# Patient Record
Sex: Female | Born: 1995 | Marital: Single | Smoking: Never smoker
Health system: Southern US, Community
[De-identification: ages and names within clinical notes are randomized; demographics above are authoritative.]

---

## 2017-01-25 DIAGNOSIS — Z8751 Personal history of pre-term labor: Secondary | ICD-10-CM | POA: Insufficient documentation

## 2021-04-24 DIAGNOSIS — Z113 Encounter for screening for infections with a predominantly sexual mode of transmission: Secondary | ICD-10-CM | POA: Diagnosis not present

## 2021-04-24 DIAGNOSIS — Z01419 Encounter for gynecological examination (general) (routine) without abnormal findings: Secondary | ICD-10-CM | POA: Diagnosis not present

## 2021-04-24 DIAGNOSIS — Z124 Encounter for screening for malignant neoplasm of cervix: Secondary | ICD-10-CM | POA: Diagnosis not present

## 2021-04-24 DIAGNOSIS — Z3041 Encounter for surveillance of contraceptive pills: Secondary | ICD-10-CM | POA: Diagnosis not present

## 2021-04-27 LAB — HM PAP SMEAR: HM Pap smear: NEGATIVE

## 2021-06-19 ENCOUNTER — Ambulatory Visit (INDEPENDENT_AMBULATORY_CARE_PROVIDER_SITE_OTHER): Payer: BC Managed Care – PPO | Admitting: Medical-Surgical

## 2021-06-19 ENCOUNTER — Encounter: Payer: Self-pay | Admitting: Medical-Surgical

## 2021-06-19 ENCOUNTER — Other Ambulatory Visit: Payer: Self-pay

## 2021-06-19 VITALS — BP 118/78 | HR 68 | Resp 20 | Ht 60.24 in | Wt 173.1 lb

## 2021-06-19 DIAGNOSIS — Z13220 Encounter for screening for lipoid disorders: Secondary | ICD-10-CM

## 2021-06-19 DIAGNOSIS — Z131 Encounter for screening for diabetes mellitus: Secondary | ICD-10-CM

## 2021-06-19 DIAGNOSIS — Z1329 Encounter for screening for other suspected endocrine disorder: Secondary | ICD-10-CM | POA: Diagnosis not present

## 2021-06-19 DIAGNOSIS — R1011 Right upper quadrant pain: Secondary | ICD-10-CM | POA: Diagnosis not present

## 2021-06-19 DIAGNOSIS — R0781 Pleurodynia: Secondary | ICD-10-CM | POA: Diagnosis not present

## 2021-06-19 DIAGNOSIS — R1909 Other intra-abdominal and pelvic swelling, mass and lump: Secondary | ICD-10-CM

## 2021-06-19 DIAGNOSIS — S6710XA Crushing injury of unspecified finger(s), initial encounter: Secondary | ICD-10-CM

## 2021-06-19 NOTE — Progress Notes (Signed)
New Patient Office Visit  Subjective:  Patient ID: Lynn Newton, female    DOB: April 26, 1996  Age: 25 y.o. MRN: [id number]  CC:  Chief Complaint  Patient presents with   Establish Care    HPI Lynn Newton presents to establish care.   Left groin lump-has had a left groin lump and the pubic area for approximately 6 months.  It does get larger and can be tender to touch at times.  Has squeezed on the lump several times and has been able to express some discharge.  Shaves in the pubic area.  No concern for STIs.  In a monogamous relationship with 1 female partner, no condoms.  Right rib/right upper quadrant pain-has had intermittent pain going on for the last couple of years but this has become more frequent.  Happens when she eats greasy foods.  She does try to avoid greasy foods but occasionally does indulge in pizza and wings.  Whenever she does eat these type foods, the pain occurs approximately 2 hours after.  She has nausea and vomiting that accompany the pain and reports that it does not improve until she throws up.  No fevers, chills, constipation,, diarrhea, chest pain, or shortness of breath.  Approximately 3 weeks ago, had a injury to her right hand over the lid of a trunk slammed down over her finger.  She has a lump on the proximal fourth finger that is new since the injury.  Denies pain at the site.  History reviewed. No pertinent past medical history.  History reviewed. No pertinent surgical history.  History reviewed. No pertinent family history.  Social History   Socioeconomic History   Marital status: Single    Spouse name: Not on file   Number of children: Not on file   Years of education: Not on file   Highest education level: Not on file  Occupational History   Not on file  Tobacco Use   Smoking status: Not on file   Smokeless tobacco: Not on file  Substance and Sexual Activity   Alcohol use: Not on file   Drug use: Not on file   Sexual  activity: Not on file  Other Topics Concern   Not on file  Social History Narrative   Not on file   Social Determinants of Health   Financial Resource Strain: Not on file  Food Insecurity: Not on file  Transportation Needs: Not on file  Physical Activity: Not on file  Stress: Not on file  Social Connections: Not on file  Intimate Partner Violence: Not on file    ROS Review of Systems  Constitutional:  Negative for chills, fatigue, fever and unexpected weight change.  Respiratory:  Negative for cough, chest tightness, shortness of breath and wheezing.   Cardiovascular:  Negative for chest pain, palpitations and leg swelling.  Gastrointestinal:  Positive for abdominal pain, nausea and vomiting. Negative for blood in stool, constipation and diarrhea.  Endocrine: Positive for polydipsia. Negative for cold intolerance, heat intolerance, polyphagia and polyuria.  Genitourinary:  Negative for dysuria, frequency, hematuria and urgency.  Musculoskeletal:  Positive for joint swelling (Right hand proximal fourth finger).  Neurological:  Negative for dizziness, light-headedness and headaches.  Psychiatric/Behavioral:  Negative for dysphoric mood, self-injury, sleep disturbance and suicidal ideas. The patient is nervous/anxious.    Objective:   Today's Vitals: BP 118/78 (BP Location: Right Arm, Patient Position: Sitting, Cuff Size: Normal)   Pulse 68   Resp 20   Ht 5' 0.24" (1.53 m)  Wt 173 lb 1.6 oz (78.5 kg)   SpO2 97%   BMI 33.54 kg/m   Physical Exam Vitals reviewed.  Constitutional:      General: She is not in acute distress.    Appearance: Normal appearance.  HENT:     Head: Normocephalic and atraumatic.  Cardiovascular:     Rate and Rhythm: Normal rate and regular rhythm.     Pulses: Normal pulses.     Heart sounds: Normal heart sounds. No murmur heard.   No friction rub. No gallop.  Pulmonary:     Effort: Pulmonary effort is normal. No respiratory distress.     Breath  sounds: Normal breath sounds. No wheezing.  Abdominal:     General: Bowel sounds are normal. There is no distension.     Palpations: Abdomen is soft. There is no fluid wave, hepatomegaly, splenomegaly, mass or pulsatile mass.     Tenderness: There is no abdominal tenderness.     Hernia: No hernia is present.  Genitourinary:    Comments: No left groin/pubic lump visible or palpable during exam today.  Patient unable to pinpoint location during appointment. Musculoskeletal:     Right hand: Swelling (Proximal dorsal right fourth finger) present.  Skin:    General: Skin is warm and dry.  Neurological:     Mental Status: She is alert and oriented to person, place, and time.  Psychiatric:        Mood and Affect: Mood normal.        Behavior: Behavior normal.        Thought Content: Thought content normal.        Judgment: Judgment normal.    Assessment & Plan:   1. Rib pain on right side 2. RUQ pain Presentation and associated pain with fatty food intake suspicious for gallbladder etiology.  Checking CBC with differential, CMP, amylase, lipase, and getting a right upper quadrant ultrasound. - CBC with Differential/Platelet - COMPLETE METABOLIC PANEL WITH GFR - Amylase - Lipase - US ABDOMEN LIMITED RUQ (LIVER/GB); Future  3. Thyroid disorder screen Checking thyroid function today. - TSH  4. Diabetes mellitus screening Checking hemoglobin A1c. - Hemoglobin A1c  5. Lipid screening Checking lipid panel. - Lipid panel  6. Crushing injury of finger of right hand No pain on exam the indicating this is likely the result of the healing process.  We will go ahead and get right hand x-rays today. - DG Hand Complete Right; Future  7. Groin lump Since we are unable to find the lump today, advised patient to continue monitoring for this.  If this returns and is painful to touch, return to our office for further evaluation before doing any manipulation of the lesion.  Suspect that shaving  predisposes her to ingrown hairs which may be causing the recurrent swelling and discomfort.  Outpatient Encounter Medications as of 06/19/2021  Medication Sig   VIENVA 0.1-20 MG-MCG tablet Take 1 tablet by mouth daily.   No facility-administered encounter medications on file as of 06/19/2021.    Follow-up: Return if symptoms worsen or fail to improve.  Further follow-up pending imaging and lab results.  Thayer Ohm, DNP, APRN, FNP-BC  [facility]

## 2021-06-20 LAB — COMPLETE METABOLIC PANEL WITH GFR
AG Ratio: 1.5 (calc) (ref 1.0–2.5)
ALT: 12 U/L (ref 6–29)
AST: 14 U/L (ref 10–30)
Albumin: 4.3 g/dL (ref 3.6–5.1)
Alkaline phosphatase (APISO): 43 U/L (ref 31–125)
BUN: 13 mg/dL (ref 7–25)
CO2: 26 mmol/L (ref 20–32)
Calcium: 9.1 mg/dL (ref 8.6–10.2)
Chloride: 104 mmol/L (ref 98–110)
Creat: 0.79 mg/dL (ref 0.50–0.96)
Globulin: 2.8 g/dL (calc) (ref 1.9–3.7)
Glucose, Bld: 73 mg/dL (ref 65–99)
Potassium: 3.9 mmol/L (ref 3.5–5.3)
Sodium: 139 mmol/L (ref 135–146)
Total Bilirubin: 0.3 mg/dL (ref 0.2–1.2)
Total Protein: 7.1 g/dL (ref 6.1–8.1)
eGFR: 106 mL/min/{1.73_m2} (ref >=60)

## 2021-06-20 LAB — CBC WITH DIFFERENTIAL/PLATELET
Absolute Monocytes: 446 cells/uL (ref 200–950)
Basophils Absolute: 41 cells/uL (ref 0–200)
Basophils Relative: 0.5 %
Eosinophils Absolute: 89 cells/uL (ref 15–500)
Eosinophils Relative: 1.1 %
HCT: 42.2 % (ref 35.0–45.0)
Hemoglobin: 14.4 g/dL (ref 11.7–15.5)
Lymphs Abs: 1928 cells/uL (ref 850–3900)
MCH: 30.1 pg (ref 27.0–33.0)
MCHC: 34.1 g/dL (ref 32.0–36.0)
MCV: 88.1 fL (ref 80.0–100.0)
MPV: 10.1 fL (ref 7.5–12.5)
Monocytes Relative: 5.5 %
Neutro Abs: 5597 cells/uL (ref 1500–7800)
Neutrophils Relative %: 69.1 %
Platelets: 294 10*3/uL (ref 140–400)
RBC: 4.79 10*6/uL (ref 3.80–5.10)
RDW: 12.3 % (ref 11.0–15.0)
Total Lymphocyte: 23.8 %
WBC: 8.1 10*3/uL (ref 3.8–10.8)

## 2021-06-20 LAB — HEMOGLOBIN A1C
Hgb A1c MFr Bld: 5.1 % of total Hgb (ref <5.7)
Mean Plasma Glucose: 100 mg/dL
eAG (mmol/L): 5.5 mmol/L

## 2021-06-20 LAB — LIPID PANEL
Cholesterol: 147 mg/dL (ref <200)
HDL: 60 mg/dL (ref >=50)
LDL Cholesterol (Calc): 67 mg/dL (calc)
Non-HDL Cholesterol (Calc): 87 mg/dL (calc) (ref <130)
Total CHOL/HDL Ratio: 2.5 (calc) (ref <5.0)
Triglycerides: 112 mg/dL (ref <150)

## 2021-06-20 LAB — LIPASE: Lipase: 17 U/L (ref 7–60)

## 2021-06-20 LAB — AMYLASE: Amylase: 37 U/L (ref 21–101)

## 2021-06-20 LAB — TSH: TSH: 0.84 mIU/L

## 2021-06-26 ENCOUNTER — Other Ambulatory Visit: Payer: BC Managed Care – PPO

## 2021-07-03 ENCOUNTER — Other Ambulatory Visit: Payer: Self-pay

## 2021-07-03 ENCOUNTER — Telehealth: Payer: Self-pay | Admitting: Medical-Surgical

## 2021-07-03 ENCOUNTER — Ambulatory Visit (INDEPENDENT_AMBULATORY_CARE_PROVIDER_SITE_OTHER): Payer: BC Managed Care – PPO

## 2021-07-03 DIAGNOSIS — R7989 Other specified abnormal findings of blood chemistry: Secondary | ICD-10-CM | POA: Diagnosis not present

## 2021-07-03 DIAGNOSIS — R1011 Right upper quadrant pain: Secondary | ICD-10-CM

## 2021-07-03 DIAGNOSIS — K8020 Calculus of gallbladder without cholecystitis without obstruction: Secondary | ICD-10-CM | POA: Diagnosis not present

## 2021-07-03 NOTE — Telephone Encounter (Signed)
Patient dropped off forms to be filled out by PCP, stated that she had an appointment with PCP a few weeks ago (looked like a new patient appt). Billing form attached and placed in provider box. AM

## 2021-07-07 ENCOUNTER — Encounter: Payer: Self-pay | Admitting: Medical-Surgical

## 2021-07-07 DIAGNOSIS — K8020 Calculus of gallbladder without cholecystitis without obstruction: Secondary | ICD-10-CM

## 2021-07-08 NOTE — Telephone Encounter (Signed)
Attempted to call pt, no answer. Will try again at a later time.

## 2021-07-09 ENCOUNTER — Encounter: Payer: Self-pay | Admitting: Medical-Surgical

## 2021-07-09 NOTE — Telephone Encounter (Signed)
Forms completed and put in front office. Pt notified.

## 2021-07-28 ENCOUNTER — Encounter: Payer: Self-pay | Admitting: Medical-Surgical

## 2021-08-14 ENCOUNTER — Ambulatory Visit: Payer: Self-pay | Admitting: Surgery

## 2021-08-14 DIAGNOSIS — K8050 Calculus of bile duct without cholangitis or cholecystitis without obstruction: Secondary | ICD-10-CM

## 2022-01-07 DIAGNOSIS — J020 Streptococcal pharyngitis: Secondary | ICD-10-CM | POA: Diagnosis not present

## 2022-03-30 ENCOUNTER — Ambulatory Visit: Payer: Self-pay | Admitting: Surgery

## 2022-03-30 DIAGNOSIS — K8020 Calculus of gallbladder without cholecystitis without obstruction: Secondary | ICD-10-CM

## 2022-03-30 DIAGNOSIS — K8050 Calculus of bile duct without cholangitis or cholecystitis without obstruction: Secondary | ICD-10-CM | POA: Diagnosis not present

## 2022-04-29 DIAGNOSIS — Z01419 Encounter for gynecological examination (general) (routine) without abnormal findings: Secondary | ICD-10-CM | POA: Diagnosis not present

## 2022-04-29 DIAGNOSIS — Z113 Encounter for screening for infections with a predominantly sexual mode of transmission: Secondary | ICD-10-CM | POA: Diagnosis not present

## 2022-04-29 DIAGNOSIS — Z6833 Body mass index (BMI) 33.0-33.9, adult: Secondary | ICD-10-CM | POA: Diagnosis not present

## 2022-04-29 DIAGNOSIS — Z3041 Encounter for surveillance of contraceptive pills: Secondary | ICD-10-CM | POA: Diagnosis not present

## 2022-07-11 NOTE — Progress Notes (Unsigned)
   Complete physical exam  Patient: Lynn Newton   DOB: 1996/06/17   26 y.o. Female  MRN: [id number]  Subjective:    No chief complaint on file.   Lynn Newton is a 26 y.o. female who presents today for a complete physical exam. She reports consuming a {diet types:17450} diet. {types:19826} She generally feels {DESC; WELL/FAIRLY WELL/POORLY:18703}. She reports sleeping {DESC; WELL/FAIRLY WELL/POORLY:18703}. She {does/does not:200015} have additional problems to discuss today.    Most recent fall risk assessment:     No data to display           Most recent depression screenings:     No data to display          {VISON DENTAL STD PSA (Optional):27386}  {History (Optional):23778}  Patient Care Team: Samuel Bouche, NP as PCP - General (Nurse Practitioner)   Outpatient Medications Prior to Visit  Medication Sig   VIENVA 0.1-20 MG-MCG tablet Take 1 tablet by mouth daily.   No facility-administered medications prior to visit.    ROS        Objective:     There were no vitals taken for this visit. {Vitals History (Optional):23777}  Physical Exam   No results found for any visits on 07/12/22. {Show previous labs (optional):23779}    Assessment & Plan:    Routine Health Maintenance and Physical Exam  Immunization History  Administered Date(s) Administered   DTaP 08/17/1996, 10/15/1996, 12/17/1996, 07/03/2002   DTaP / HiB 08/17/1996, 10/15/1996, 12/17/1996   Hepatitis B, PED/ADOLESCENT 08/17/1996, 12/17/1996   IPV 08/17/1996, 10/15/1996, 07/03/2002   MMR 07/03/2002, 08/09/2002   PFIZER Comirnaty(Gray Top)Covid-19 Tri-Sucrose Vaccine 08/25/2020, 09/15/2020    Health Maintenance  Topic Date Due   HPV VACCINES (1 - 2-dose series) Never done   Hepatitis C Screening  Never done   TETANUS/TDAP  Never done   PAP-Cervical Cytology Screening  Never done   PAP SMEAR-Modifier  Never done   COVID-19 Vaccine (3 - Pfizer series) 11/10/2020    INFLUENZA VACCINE  Never done   HIV Screening  Completed    Discussed health benefits of physical activity, and encouraged her to engage in regular exercise appropriate for her age and condition.  Problem List Items Addressed This Visit   None Visit Diagnoses     Annual physical exam    -  Primary   Cervical cancer screening       Need for HPV vaccination       Need for hepatitis C screening test       Encounter for screening for HIV       Need for Tdap vaccination       Need for influenza vaccination          No follow-ups on file.     Samuel Bouche, NP

## 2022-07-12 ENCOUNTER — Ambulatory Visit (INDEPENDENT_AMBULATORY_CARE_PROVIDER_SITE_OTHER): Payer: BC Managed Care – PPO | Admitting: Medical-Surgical

## 2022-07-12 ENCOUNTER — Encounter: Payer: Self-pay | Admitting: Medical-Surgical

## 2022-07-12 VITALS — BP 98/64 | HR 67 | Resp 20 | Ht 60.04 in | Wt 170.9 lb

## 2022-07-12 DIAGNOSIS — Z1159 Encounter for screening for other viral diseases: Secondary | ICD-10-CM | POA: Diagnosis not present

## 2022-07-12 DIAGNOSIS — Z124 Encounter for screening for malignant neoplasm of cervix: Secondary | ICD-10-CM | POA: Diagnosis not present

## 2022-07-12 DIAGNOSIS — E6609 Other obesity due to excess calories: Secondary | ICD-10-CM

## 2022-07-12 DIAGNOSIS — Z0000 Encounter for general adult medical examination without abnormal findings: Secondary | ICD-10-CM | POA: Diagnosis not present

## 2022-07-12 DIAGNOSIS — Z114 Encounter for screening for human immunodeficiency virus [HIV]: Secondary | ICD-10-CM

## 2022-07-12 DIAGNOSIS — Z23 Encounter for immunization: Secondary | ICD-10-CM

## 2022-07-12 DIAGNOSIS — Z113 Encounter for screening for infections with a predominantly sexual mode of transmission: Secondary | ICD-10-CM | POA: Diagnosis not present

## 2022-07-12 DIAGNOSIS — Z6833 Body mass index (BMI) 33.0-33.9, adult: Secondary | ICD-10-CM

## 2022-07-13 LAB — WET PREP FOR TRICH, YEAST, CLUE
MICRO NUMBER:: 13898895
Specimen Quality: ADEQUATE

## 2022-07-14 LAB — COMPLETE METABOLIC PANEL WITH GFR
AG Ratio: 1.3 (calc) (ref 1.0–2.5)
ALT: 9 U/L (ref 6–29)
AST: 15 U/L (ref 10–30)
Albumin: 4.1 g/dL (ref 3.6–5.1)
Alkaline phosphatase (APISO): 40 U/L (ref 31–125)
BUN: 13 mg/dL (ref 7–25)
CO2: 27 mmol/L (ref 20–32)
Calcium: 9.3 mg/dL (ref 8.6–10.2)
Chloride: 102 mmol/L (ref 98–110)
Creat: 0.79 mg/dL (ref 0.50–0.96)
Globulin: 3.1 g/dL (calc) (ref 1.9–3.7)
Glucose, Bld: 90 mg/dL (ref 65–99)
Potassium: 4.1 mmol/L (ref 3.5–5.3)
Sodium: 137 mmol/L (ref 135–146)
Total Bilirubin: 0.2 mg/dL (ref 0.2–1.2)
Total Protein: 7.2 g/dL (ref 6.1–8.1)
eGFR: 106 mL/min/{1.73_m2} (ref >=60)

## 2022-07-14 LAB — CBC WITH DIFFERENTIAL/PLATELET
Absolute Monocytes: 504 cells/uL (ref 200–950)
Basophils Absolute: 37 cells/uL (ref 0–200)
Basophils Relative: 0.5 %
Eosinophils Absolute: 131 cells/uL (ref 15–500)
Eosinophils Relative: 1.8 %
HCT: 39.4 % (ref 35.0–45.0)
Hemoglobin: 13.3 g/dL (ref 11.7–15.5)
Lymphs Abs: 3051 cells/uL (ref 850–3900)
MCH: 29.8 pg (ref 27.0–33.0)
MCHC: 33.8 g/dL (ref 32.0–36.0)
MCV: 88.3 fL (ref 80.0–100.0)
MPV: 9.8 fL (ref 7.5–12.5)
Monocytes Relative: 6.9 %
Neutro Abs: 3577 cells/uL (ref 1500–7800)
Neutrophils Relative %: 49 %
Platelets: 269 10*3/uL (ref 140–400)
RBC: 4.46 10*6/uL (ref 3.80–5.10)
RDW: 12 % (ref 11.0–15.0)
Total Lymphocyte: 41.8 %
WBC: 7.3 10*3/uL (ref 3.8–10.8)

## 2022-07-14 LAB — LIPID PANEL
Cholesterol: 135 mg/dL (ref <200)
HDL: 58 mg/dL (ref >=50)
LDL Cholesterol (Calc): 58 mg/dL (calc)
Non-HDL Cholesterol (Calc): 77 mg/dL (calc) (ref <130)
Total CHOL/HDL Ratio: 2.3 (calc) (ref <5.0)
Triglycerides: 106 mg/dL (ref <150)

## 2022-07-14 LAB — RPR: RPR Ser Ql: NONREACTIVE

## 2022-07-14 LAB — HEPATITIS B SURFACE ANTIGEN: Hepatitis B Surface Ag: NONREACTIVE

## 2022-07-14 LAB — HEMOGLOBIN A1C
Hgb A1c MFr Bld: 5 % of total Hgb (ref <5.7)
Mean Plasma Glucose: 97 mg/dL
eAG (mmol/L): 5.4 mmol/L

## 2022-07-14 LAB — C. TRACHOMATIS/N. GONORRHOEAE RNA
C. trachomatis RNA, TMA: NOT DETECTED
N. gonorrhoeae RNA, TMA: NOT DETECTED

## 2022-07-14 LAB — HIV ANTIBODY (ROUTINE TESTING W REFLEX): HIV 1&2 Ab, 4th Generation: NONREACTIVE

## 2022-07-14 LAB — HEPATITIS C ANTIBODY: Hepatitis C Ab: NONREACTIVE

## 2022-07-28 ENCOUNTER — Encounter: Payer: Self-pay | Admitting: Medical-Surgical

## 2023-06-18 IMAGING — US US ABDOMEN LIMITED
1 series · 14 of 25 positions shown · non-contrast
Comparison: None.

CLINICAL DATA: Abnormal LFTs, intermittent right upper quadrant
pain x2 years.

EXAM:
ULTRASOUND ABDOMEN LIMITED RIGHT UPPER QUADRANT

[Series 1: us abdomen limited ruq (liver/gb) · 14 of 69 slices shown]
[im 1/69]
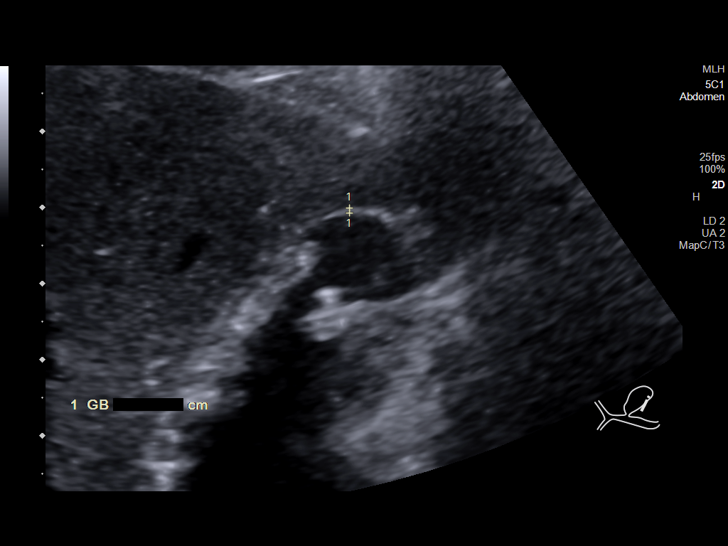
[im 6/69]
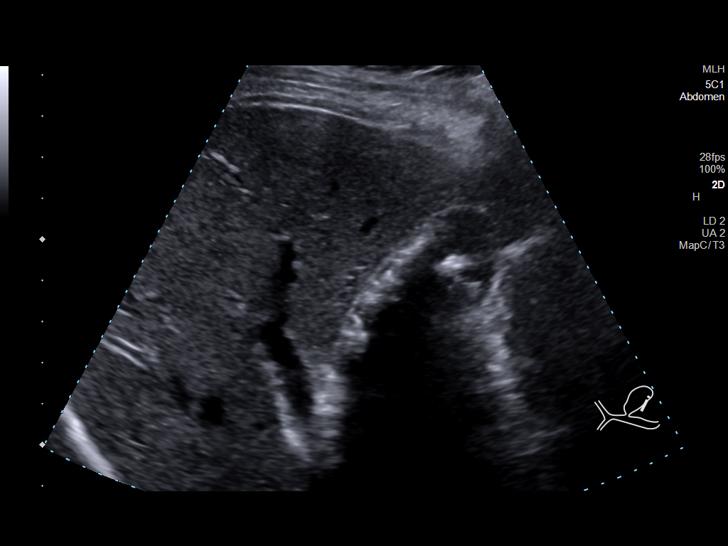
[im 12/69]
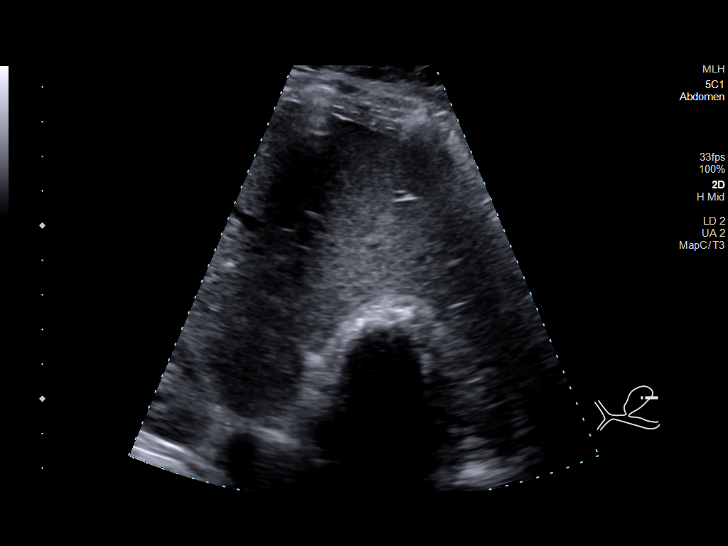
[im 18/69]
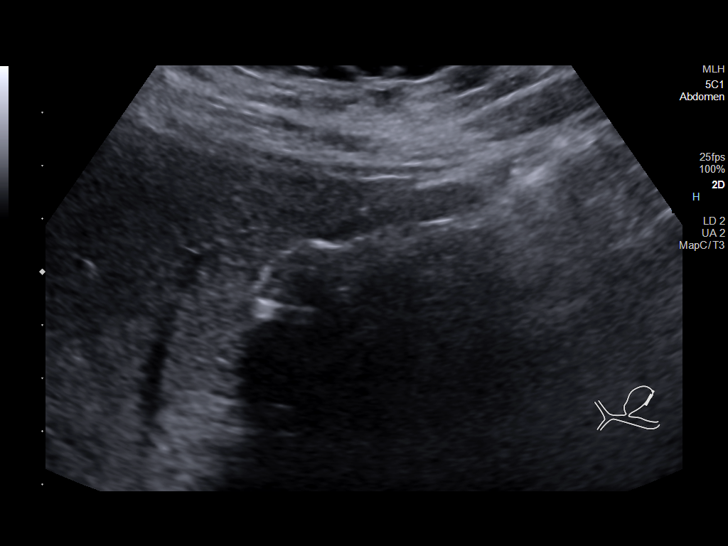
[im 23/69]
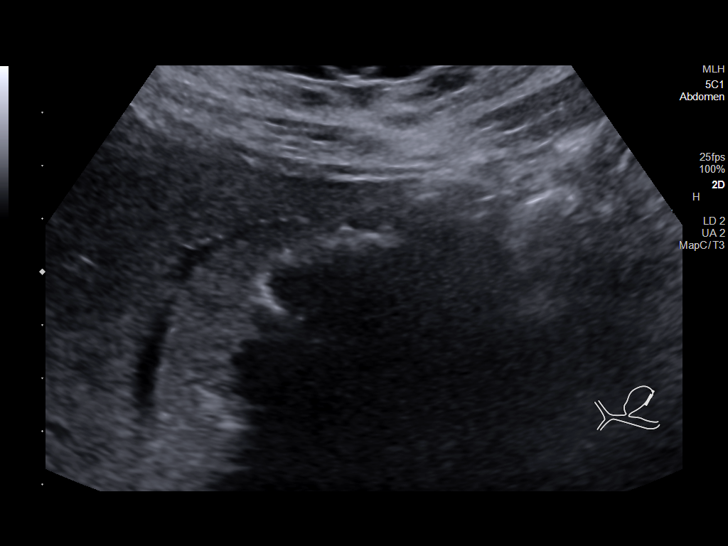
[im 26/69]
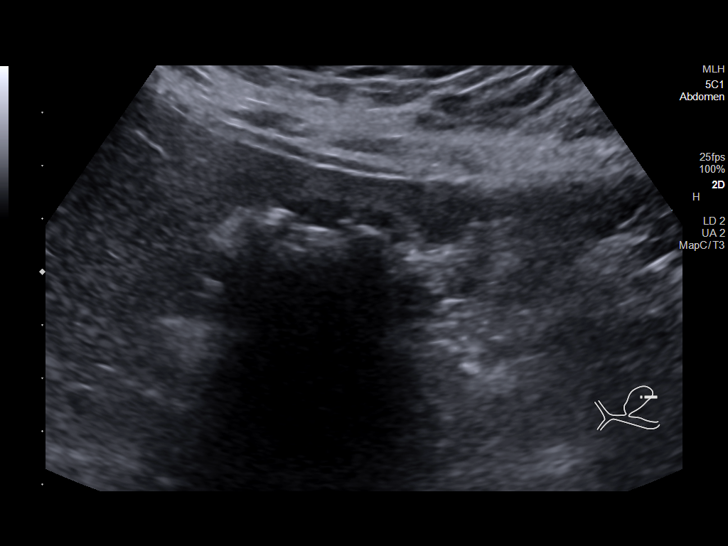
[im 32/69]
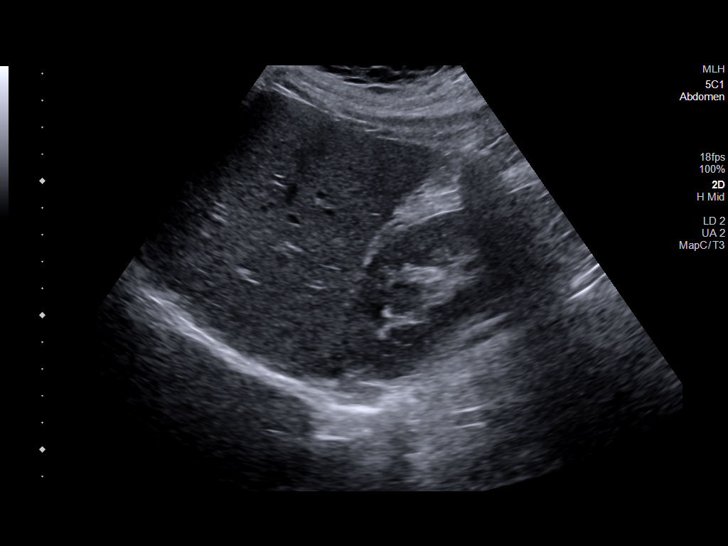
[im 37/69]
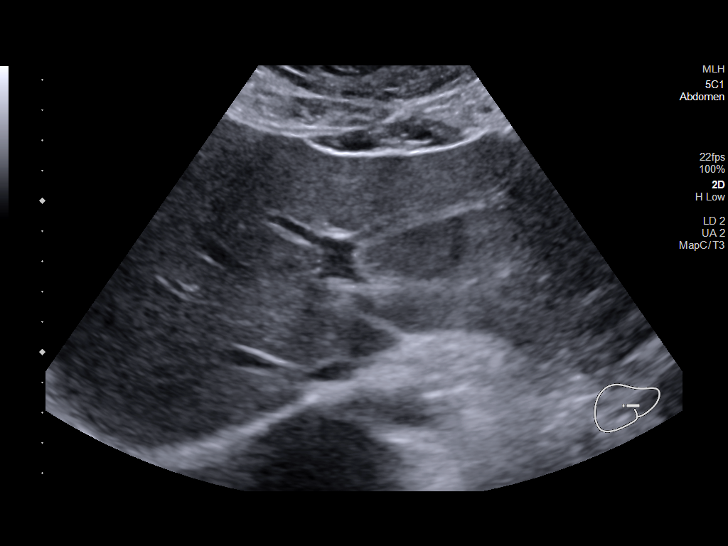
[im 43/69]
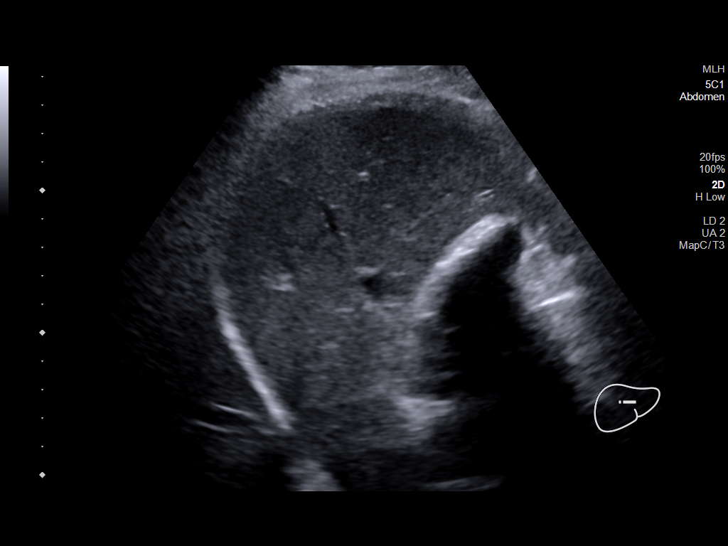
[im 46/69]
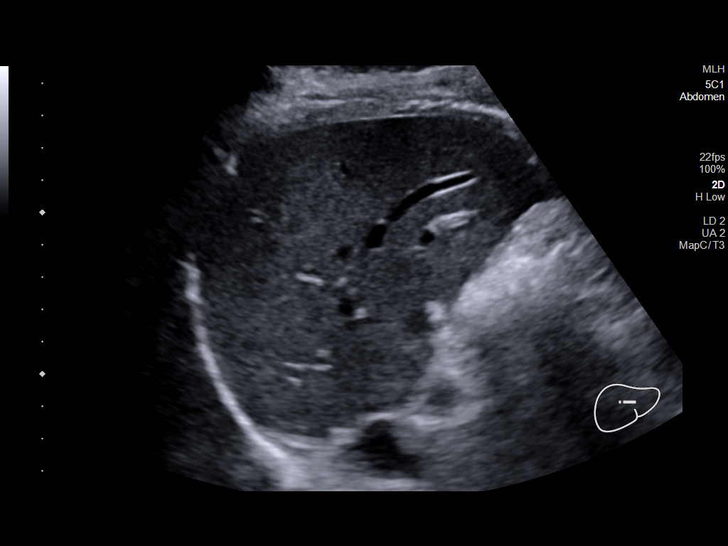
[im 52/69]
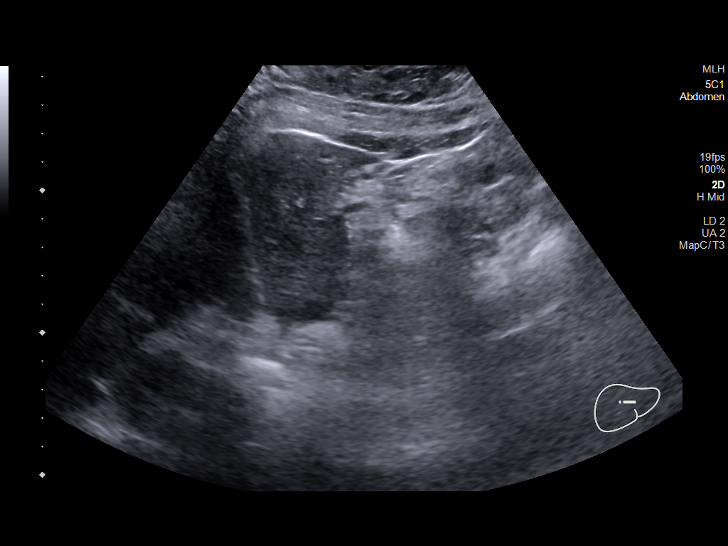
[im 57/69]
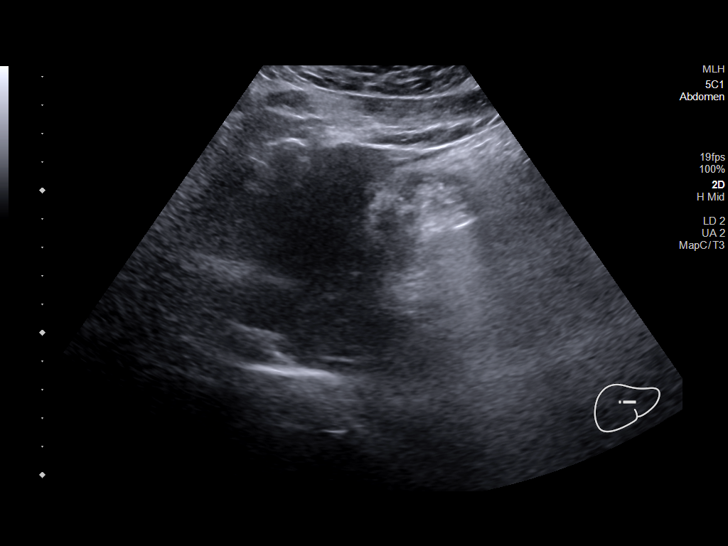
[im 63/69]
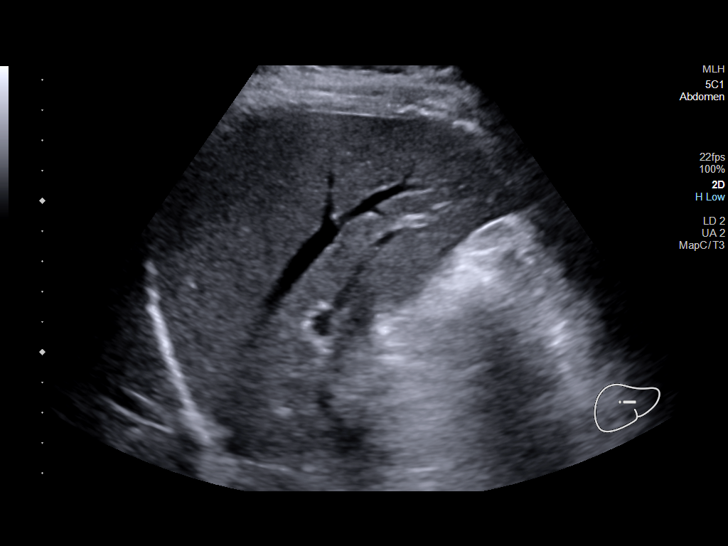
[im 69/69]
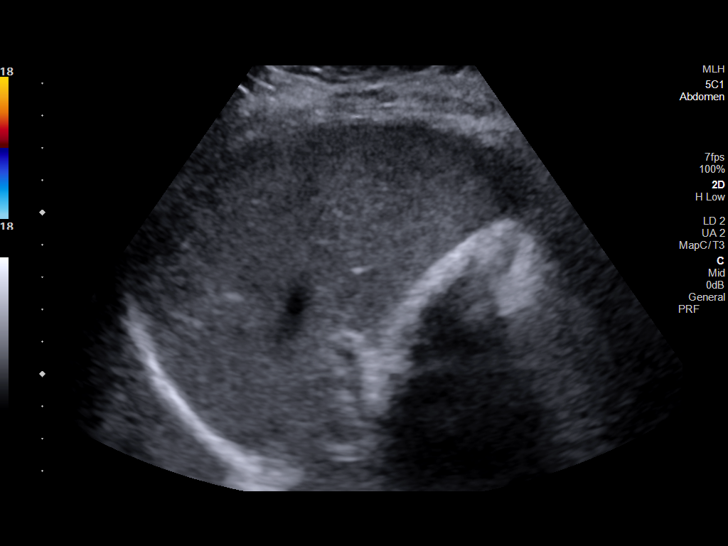

[14 of 25 positions shown; findings below may reference images not displayed]

FINDINGS: Gallbladder:

Multiple gallstones measuring up to 1.2 cm. No pericholecystic fluid
or wall thickening visualized. No sonographic Murphy sign noted by
sonographer.

Common bile duct:

Diameter: 2 mm

Liver:

No focal lesion identified. Within normal limits in parenchymal
echogenicity. Portal vein is patent on color Doppler imaging with
normal direction of blood flow towards the liver.

Other: None.
IMPRESSION: Cholelithiasis without sonographic evidence of acute cholecystitis.

## 2023-07-14 ENCOUNTER — Encounter: Payer: BC Managed Care – PPO | Admitting: Medical-Surgical
